# Patient Record
Sex: Male | Born: 1935 | Race: White | Hispanic: No | Marital: Married
Health system: Southern US, Community
[De-identification: ages and names within clinical notes are randomized; demographics above are authoritative.]

---

## 2007-11-04 ENCOUNTER — Ambulatory Visit: Payer: Self-pay | Admitting: Internal Medicine

## 2008-08-01 ENCOUNTER — Ambulatory Visit: Payer: Self-pay | Admitting: Family Medicine

## 2008-11-28 ENCOUNTER — Ambulatory Visit: Payer: Self-pay | Admitting: Internal Medicine

## 2008-11-29 ENCOUNTER — Ambulatory Visit: Payer: Self-pay | Admitting: Internal Medicine

## 2008-11-30 ENCOUNTER — Ambulatory Visit: Payer: Self-pay | Admitting: Internal Medicine

## 2008-12-03 ENCOUNTER — Ambulatory Visit: Payer: Self-pay | Admitting: Internal Medicine

## 2008-12-05 ENCOUNTER — Ambulatory Visit: Payer: Self-pay | Admitting: Internal Medicine

## 2011-12-26 ENCOUNTER — Ambulatory Visit: Payer: Self-pay | Admitting: Orthopedic Surgery

## 2012-12-28 ENCOUNTER — Ambulatory Visit: Payer: Self-pay | Admitting: Family Medicine

## 2014-10-08 ENCOUNTER — Ambulatory Visit: Payer: Self-pay | Admitting: Family Medicine

## 2015-01-09 ENCOUNTER — Ambulatory Visit: Payer: Self-pay | Admitting: Family Medicine

## 2017-03-01 ENCOUNTER — Other Ambulatory Visit
Admission: RE | Admit: 2017-03-01 | Discharge: 2017-03-01 | Disposition: A | Discharge status: Home / Self Care | Payer: Medicare PPO | Source: Ambulatory Visit | Attending: Gastroenterology | Admitting: Gastroenterology

## 2017-03-01 DIAGNOSIS — R197 Diarrhea, unspecified: Secondary | ICD-10-CM | POA: Insufficient documentation

## 2017-03-01 LAB — GASTROINTESTINAL PANEL BY PCR, STOOL (REPLACES STOOL CULTURE)

## 2017-03-01 LAB — C DIFFICILE QUICK SCREEN W PCR REFLEX
C DIFFICLE (CDIFF) ANTIGEN: NEGATIVE
C Diff interpretation: NOT DETECTED
C Diff toxin: NEGATIVE

## 2017-04-28 ENCOUNTER — Ambulatory Visit
Admission: RE | Admit: 2017-04-28 | Payer: Medicare PPO | Source: Ambulatory Visit | Admitting: Unknown Physician Specialty

## 2017-04-28 ENCOUNTER — Encounter: Admission: RE | Payer: Self-pay | Source: Ambulatory Visit

## 2017-04-28 SURGERY — COLONOSCOPY WITH PROPOFOL
Anesthesia: General

## 2020-10-22 ENCOUNTER — Other Ambulatory Visit: Payer: Self-pay | Admitting: Specialist

## 2020-10-22 ENCOUNTER — Ambulatory Visit
Admission: RE | Admit: 2020-10-22 | Discharge: 2020-10-22 | Disposition: A | Discharge status: Home / Self Care | Payer: Medicare PPO | Source: Ambulatory Visit | Attending: Interventional Radiology | Admitting: Interventional Radiology

## 2020-10-22 ENCOUNTER — Ambulatory Visit
Admission: RE | Admit: 2020-10-22 | Discharge: 2020-10-22 | Disposition: A | Discharge status: Home / Self Care | Payer: Medicare PPO | Source: Ambulatory Visit | Attending: Specialist | Admitting: Specialist

## 2020-10-22 ENCOUNTER — Other Ambulatory Visit: Payer: Self-pay

## 2020-10-22 ENCOUNTER — Other Ambulatory Visit: Payer: Self-pay | Admitting: Interventional Radiology

## 2020-10-22 ENCOUNTER — Other Ambulatory Visit
Admission: RE | Admit: 2020-10-22 | Discharge: 2020-10-22 | Disposition: A | Discharge status: Home / Self Care | Payer: Medicare PPO | Source: Ambulatory Visit | Attending: Specialist | Admitting: Specialist

## 2020-10-22 DIAGNOSIS — J90 Pleural effusion, not elsewhere classified: Secondary | ICD-10-CM

## 2020-10-22 DIAGNOSIS — Z9889 Other specified postprocedural states: Secondary | ICD-10-CM

## 2020-10-22 DIAGNOSIS — Z20822 Contact with and (suspected) exposure to covid-19: Secondary | ICD-10-CM | POA: Insufficient documentation

## 2020-10-22 LAB — LACTATE DEHYDROGENASE, PLEURAL OR PERITONEAL FLUID: LD, Fluid: 62 U/L — ABNORMAL HIGH (ref 3–23)

## 2020-10-22 LAB — GLUCOSE, PLEURAL OR PERITONEAL FLUID: Glucose, Fluid: 111 mg/dL

## 2020-10-22 LAB — AMYLASE, PLEURAL OR PERITONEAL FLUID: Amylase, Fluid: 12 U/L

## 2020-10-22 LAB — BODY FLUID CELL COUNT WITH DIFFERENTIAL
Eos, Fluid: 0 %
Lymphs, Fluid: 16 %
Monocyte-Macrophage-Serous Fluid: 32 %
Neutrophil Count, Fluid: 52 %
Total Nucleated Cell Count, Fluid: 51 cu mm

## 2020-10-22 LAB — PROTEIN, PLEURAL OR PERITONEAL FLUID: Total protein, fluid: <3 g/dL

## 2020-10-22 LAB — SARS CORONAVIRUS 2 BY RT PCR (HOSPITAL ORDER, PERFORMED IN ~~LOC~~ HOSPITAL LAB): SARS Coronavirus 2: NEGATIVE

## 2020-10-22 NOTE — Procedures (Signed)
Interventional Radiology Procedure Note  Procedure: [place] RT THORA    Complications: None  Estimated Blood Loss:  0  Findings: 900CC REMOVED LABS SENT    Sharen Counter, MD

## 2020-10-23 LAB — ACID FAST SMEAR (AFB, MYCOBACTERIA): Acid Fast Smear: NEGATIVE

## 2020-10-23 LAB — PROTEIN, BODY FLUID (OTHER): Total Protein, Body Fluid Other: 2.8 g/dL

## 2020-10-24 LAB — CYTOLOGY - NON PAP

## 2020-10-26 LAB — BODY FLUID CULTURE
Culture: NO GROWTH
Gram Stain: NONE SEEN

## 2020-10-29 LAB — CHOLESTEROL, BODY FLUID: Cholesterol, Fluid: 38 mg/dL

## 2020-11-22 LAB — FUNGUS CULTURE WITH STAIN

## 2020-11-22 LAB — FUNGUS CULTURE RESULT

## 2020-11-22 LAB — FUNGAL ORGANISM REFLEX

## 2020-12-03 DEATH — deceased

## 2020-12-05 LAB — ACID FAST CULTURE WITH REFLEXED SENSITIVITIES (MYCOBACTERIA): Acid Fast Culture: NEGATIVE

## 2022-06-21 IMAGING — US US THORACENTESIS ASP PLEURAL SPACE W/IMG GUIDE
1 series · 1 of 1 positions shown · non-contrast
Comparison: none

INDICATION: Lung cancer, recurrent effusion, shortness of breath

[Series 2: us thoracentesis asp pleural space w/img guide · 0.26mm/px · 1 of 1 slices shown]
[im 1/1]
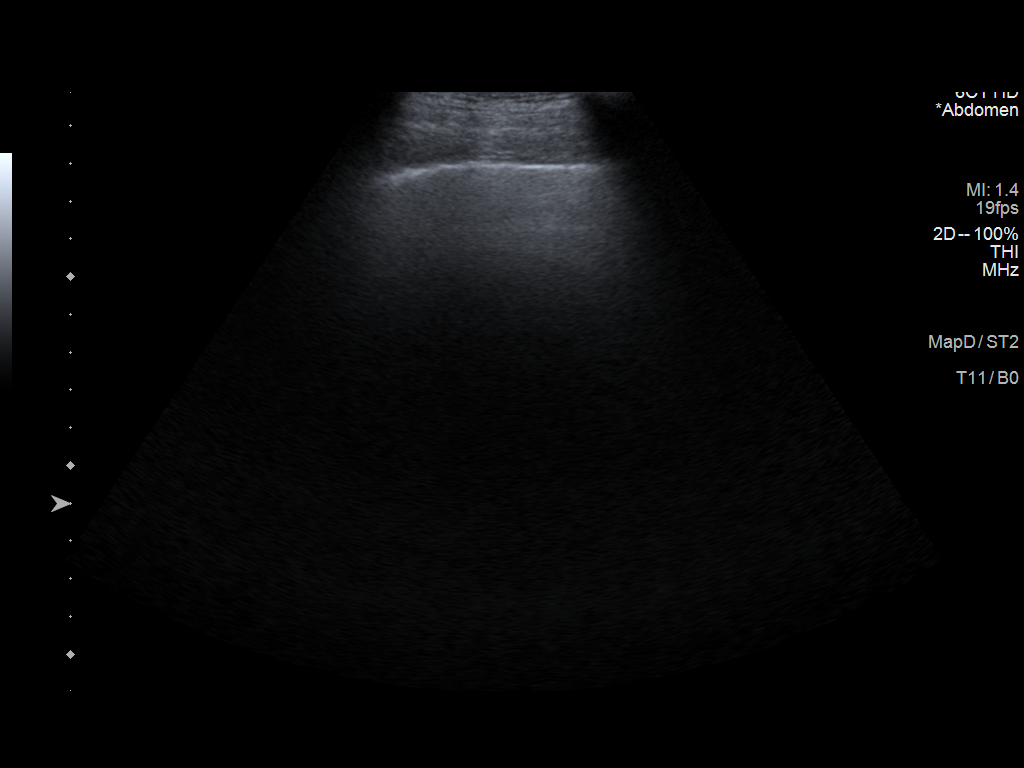

[1 of 1 positions shown; findings below may reference images not displayed]

EXAM:
ULTRASOUND GUIDED RIGHT THORACENTESIS

MEDICATIONS:
1% lidocaine local

COMPLICATIONS:
None immediate.

PROCEDURE:
An ultrasound guided thoracentesis was thoroughly discussed with the
patient and questions answered. The benefits, risks, alternatives
and complications were also discussed. The patient understands and
wishes to proceed with the procedure. Written consent was obtained.

Ultrasound was performed to localize and mark an adequate pocket of
fluid in the right chest. The area was then prepped and draped in
the normal sterile fashion. 1% Lidocaine was used for local
anesthesia. Under ultrasound guidance a 6 Fr Safe-T-Centesis
catheter was introduced. Thoracentesis was performed. The catheter
was removed and a dressing applied.
FINDINGS: A total of approximately 900 cc of clear pleural fluid was removed.
Samples were sent to the laboratory as requested by the clinical
team.
IMPRESSION: Successful ultrasound guided right thoracentesis yielding 900 cc of
pleural fluid.

## 2022-06-21 IMAGING — DX DG CHEST 1V PORT
1 series · 1 of 1 positions shown · non-contrast
Comparison: CT 01/09/2015.

CLINICAL DATA: Status post thoracentesis.

EXAM:
PORTABLE CHEST 1 VIEW

[chest ap]
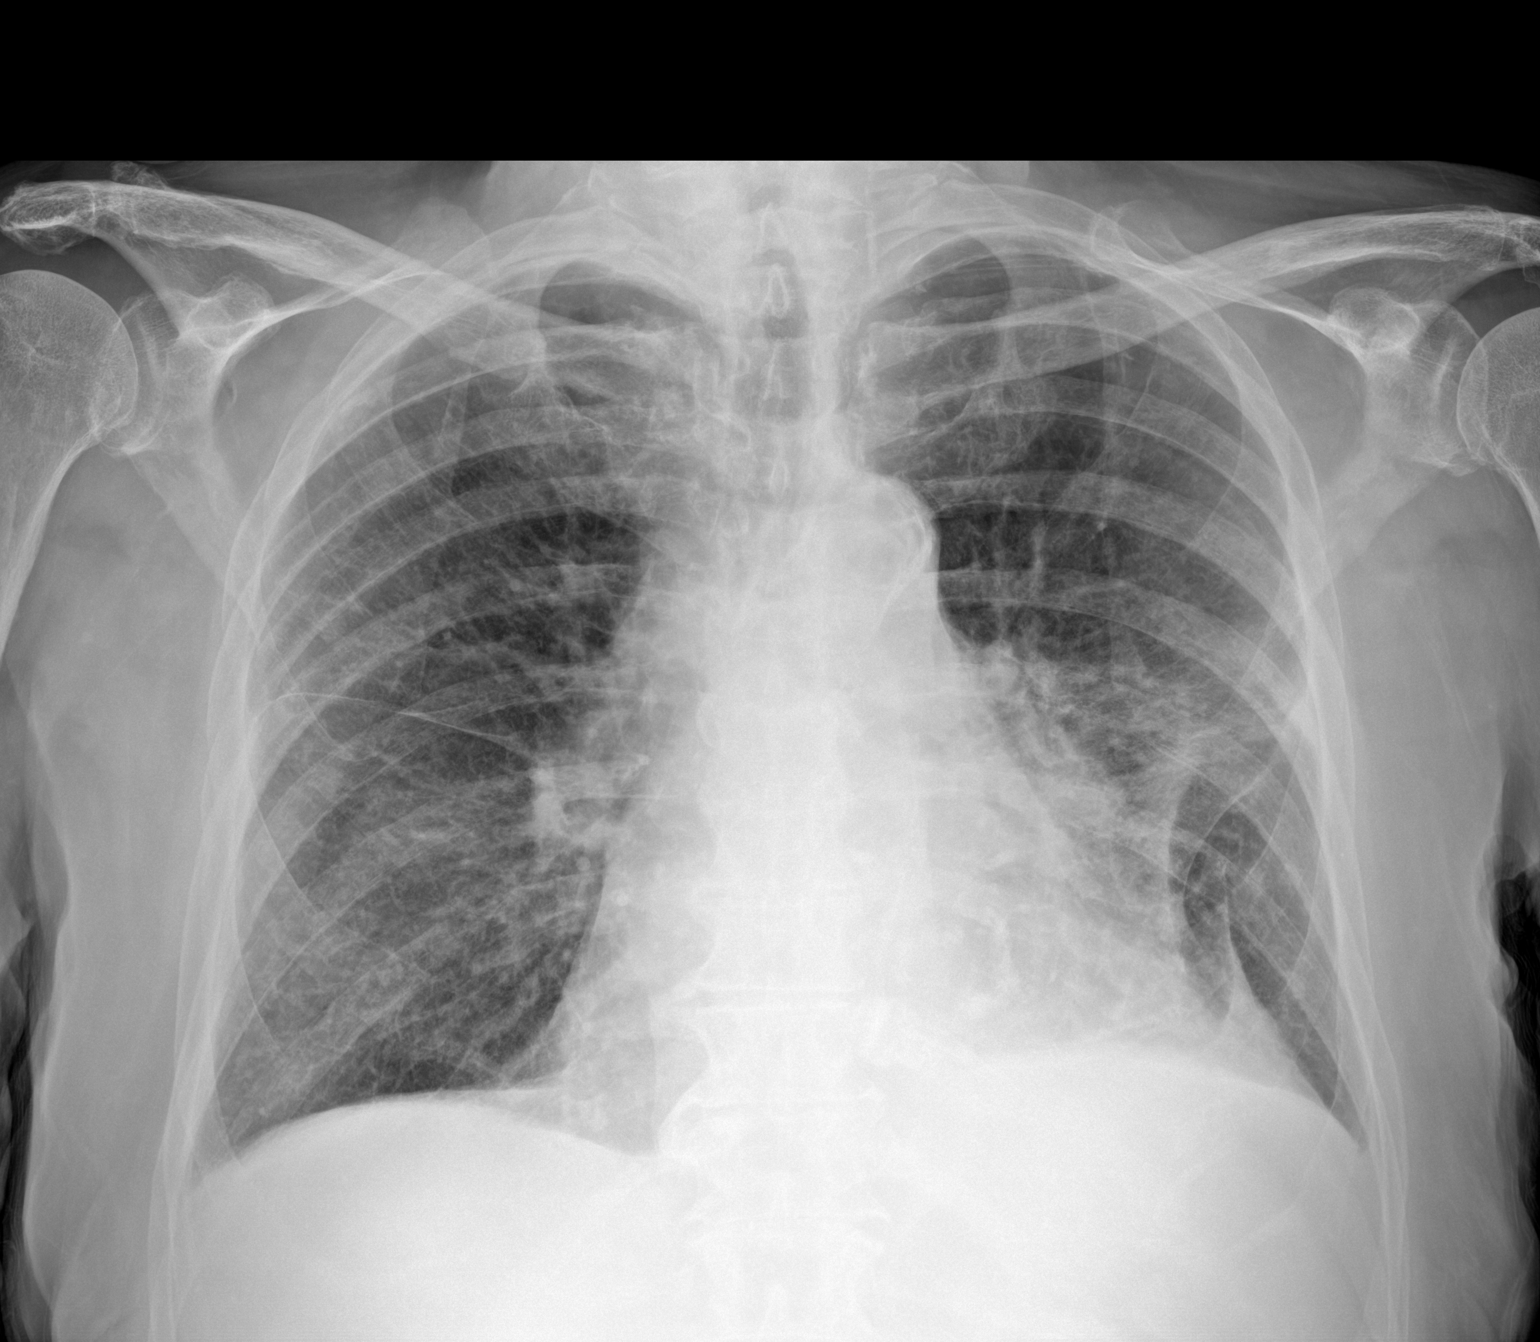

[1 of 1 positions shown; findings below may reference images not displayed]

FINDINGS: Cardiomegaly. Bilateral interstitial prominence consistent
interstitial edema and or pneumonitis. Atelectatic change prominent
atelectatic changes left mid and lower lung. Small left pleural
effusion cannot be excluded. No pneumothorax.
IMPRESSION: 1. Cardiomegaly. Bilateral interstitial prominence consistent
interstitial edema and or pneumonitis.

2.  Prominent atelectatic changes in the left mid and lower lung.
